# Patient Record
Sex: Female | Born: 1970 | Race: Asian | Hispanic: No | Marital: Married | Smoking: Never smoker
Health system: Southern US, Community
[De-identification: ages and names within clinical notes are randomized; demographics above are authoritative.]

---

## 2012-05-17 ENCOUNTER — Other Ambulatory Visit: Payer: Self-pay | Admitting: Specialist

## 2012-05-17 ENCOUNTER — Ambulatory Visit
Admission: RE | Admit: 2012-05-17 | Discharge: 2012-05-17 | Disposition: A | Discharge status: Home / Self Care | Payer: No Typology Code available for payment source | Source: Ambulatory Visit | Attending: Specialist | Admitting: Specialist

## 2012-05-17 DIAGNOSIS — R7611 Nonspecific reaction to tuberculin skin test without active tuberculosis: Secondary | ICD-10-CM

## 2019-12-05 ENCOUNTER — Other Ambulatory Visit: Payer: Self-pay | Admitting: Internal Medicine

## 2019-12-05 DIAGNOSIS — Z1231 Encounter for screening mammogram for malignant neoplasm of breast: Secondary | ICD-10-CM

## 2020-03-10 ENCOUNTER — Other Ambulatory Visit: Payer: Self-pay | Admitting: Obstetrics & Gynecology

## 2020-03-10 DIAGNOSIS — R928 Other abnormal and inconclusive findings on diagnostic imaging of breast: Secondary | ICD-10-CM

## 2020-03-15 ENCOUNTER — Ambulatory Visit
Admission: RE | Admit: 2020-03-15 | Discharge: 2020-03-15 | Disposition: A | Discharge status: Home / Self Care | Payer: Self-pay | Source: Ambulatory Visit | Attending: Obstetrics & Gynecology | Admitting: Obstetrics & Gynecology

## 2020-03-15 ENCOUNTER — Other Ambulatory Visit: Payer: Self-pay

## 2020-03-15 DIAGNOSIS — R928 Other abnormal and inconclusive findings on diagnostic imaging of breast: Secondary | ICD-10-CM

## 2020-11-08 ENCOUNTER — Other Ambulatory Visit: Payer: Self-pay | Admitting: Internal Medicine

## 2020-11-08 DIAGNOSIS — Z09 Encounter for follow-up examination after completed treatment for conditions other than malignant neoplasm: Secondary | ICD-10-CM

## 2020-11-25 ENCOUNTER — Other Ambulatory Visit: Payer: Self-pay | Admitting: Internal Medicine

## 2020-11-25 ENCOUNTER — Other Ambulatory Visit: Payer: Self-pay | Admitting: Obstetrics & Gynecology

## 2020-11-25 DIAGNOSIS — Z09 Encounter for follow-up examination after completed treatment for conditions other than malignant neoplasm: Secondary | ICD-10-CM

## 2020-12-20 ENCOUNTER — Ambulatory Visit
Admission: RE | Admit: 2020-12-20 | Discharge: 2020-12-20 | Disposition: A | Discharge status: Home / Self Care | Payer: PRIVATE HEALTH INSURANCE | Source: Ambulatory Visit | Attending: Internal Medicine | Admitting: Internal Medicine

## 2020-12-20 ENCOUNTER — Other Ambulatory Visit: Payer: Self-pay | Admitting: Internal Medicine

## 2020-12-20 ENCOUNTER — Other Ambulatory Visit: Payer: Self-pay

## 2020-12-20 DIAGNOSIS — N6320 Unspecified lump in the left breast, unspecified quadrant: Secondary | ICD-10-CM

## 2020-12-20 DIAGNOSIS — Z09 Encounter for follow-up examination after completed treatment for conditions other than malignant neoplasm: Secondary | ICD-10-CM

## 2021-03-07 ENCOUNTER — Other Ambulatory Visit: Payer: Self-pay | Admitting: Obstetrics & Gynecology

## 2021-03-08 ENCOUNTER — Ambulatory Visit
Admission: RE | Admit: 2021-03-08 | Discharge: 2021-03-08 | Disposition: A | Discharge status: Home / Self Care | Payer: PRIVATE HEALTH INSURANCE | Source: Ambulatory Visit | Attending: Internal Medicine | Admitting: Internal Medicine

## 2021-03-08 ENCOUNTER — Ambulatory Visit
Admission: RE | Admit: 2021-03-08 | Discharge: 2021-03-08 | Disposition: A | Discharge status: Home / Self Care | Payer: No Typology Code available for payment source | Source: Ambulatory Visit | Attending: Internal Medicine | Admitting: Internal Medicine

## 2021-03-08 DIAGNOSIS — N6320 Unspecified lump in the left breast, unspecified quadrant: Secondary | ICD-10-CM

## 2022-02-06 ENCOUNTER — Other Ambulatory Visit: Payer: Self-pay | Admitting: *Deleted

## 2022-02-06 DIAGNOSIS — N6320 Unspecified lump in the left breast, unspecified quadrant: Secondary | ICD-10-CM

## 2022-04-11 ENCOUNTER — Ambulatory Visit: Payer: No Typology Code available for payment source

## 2022-04-11 ENCOUNTER — Other Ambulatory Visit: Payer: No Typology Code available for payment source

## 2022-04-13 ENCOUNTER — Other Ambulatory Visit: Payer: No Typology Code available for payment source

## 2022-04-13 ENCOUNTER — Ambulatory Visit: Payer: No Typology Code available for payment source

## 2022-04-25 ENCOUNTER — Ambulatory Visit: Payer: Self-pay | Admitting: *Deleted

## 2022-04-25 ENCOUNTER — Ambulatory Visit
Admission: RE | Admit: 2022-04-25 | Discharge: 2022-04-25 | Disposition: A | Discharge status: Home / Self Care | Payer: No Typology Code available for payment source | Source: Ambulatory Visit | Attending: Obstetrics and Gynecology | Admitting: Obstetrics and Gynecology

## 2022-04-25 VITALS — BP 135/90 | Ht 63.0 in | Wt 200.0 lb

## 2022-04-25 DIAGNOSIS — Z1211 Encounter for screening for malignant neoplasm of colon: Secondary | ICD-10-CM

## 2022-04-25 DIAGNOSIS — Z1239 Encounter for other screening for malignant neoplasm of breast: Secondary | ICD-10-CM

## 2022-04-25 DIAGNOSIS — N6320 Unspecified lump in the left breast, unspecified quadrant: Secondary | ICD-10-CM

## 2022-04-25 NOTE — Progress Notes (Signed)
Ms. Isabella Christian is a 52 y.o. female who presents to [facility] clinic today with no complaints. Patient referred to [place] due to having a diagnostic mammogram and left breast ultrasound completed 03/08/2021 that a bilateral diagnostic mammogram and left breast ultrasound is recommended for follow up in one year.     Pap Smear: Pap smear not completed today. Last Pap smear was in December 2022 or January 2023 at Dr. Lynnda Child office and was normal per patient. Per patient has no history of an abnormal Pap smear. Last Pap smear result is not available in Epic.   Physical exam: Breasts Breasts symmetrical. No skin abnormalities bilateral breasts. No nipple retraction bilateral breasts. No nipple discharge bilateral breasts. No lymphadenopathy. No lumps palpated bilateral breasts. No complaints of pain or tenderness on exam.      MM DIAG BREAST TOMO BILATERAL  Result Date: 03/08/2021 CLINICAL DATA:  52 year old female presenting for annual bilateral mammogram and 1 year follow-up of a probably benign left breast mass. EXAM: DIGITAL DIAGNOSTIC BILATERAL MAMMOGRAM WITH TOMOSYNTHESIS AND CAD; ULTRASOUND LEFT BREAST LIMITED TECHNIQUE: Bilateral digital diagnostic mammography and breast tomosynthesis was performed. The images were evaluated with computer-aided detection.; Targeted ultrasound examination of the left breast was performed. COMPARISON:  Previous exam(s). ACR Breast Density Category b: There are scattered areas of fibroglandular density. FINDINGS: An oval, circumscribed equal density mass in the superior central left breast is mammographically stable. No new or suspicious findings in the remainder of either breast. Targeted ultrasound is performed, showing a stable oval, circumscribed hypoechoic mass at the deep 1 o'clock position 5 cm from the nipple. It measures 7 x 8 x 6 mm (previously 7 x 8 x 6 mm). An additional hypoechoic mass at the 12 o'clock position 7 cm from the nipple measures 1.6 x 1.3 x 0.5 cm,  also stable from prior. IMPRESSION: 1. Stable, probably benign left breast masses. Recommend a final follow-up in 1 year. 2. No mammographic evidence of malignancy on the right. RECOMMENDATION: Bilateral diagnostic mammogram and left breast ultrasound in 1 year. I have discussed the findings and recommendations with the patient. If applicable, a reminder letter will be sent to the patient regarding the next appointment. BI-RADS CATEGORY  3: Probably benign. Electronically Signed   By: Kristopher Oppenheim M.D.   On: 03/08/2021 12:52    Pelvic/Bimanual Pap is not indicated today per BCCCP guidelines.   Smoking History: Patient has never smoked.   Patient Navigation: Patient education provided. Access to services provided for patient through [place] interpreter Isabella Christian from [facility] provided.   Colorectal Cancer Screening: Per patient has never had colonoscopy completed. FIT Test given to patient to complete. No complaints today.    Breast and Cervical Cancer Risk Assessment: Patient does not have family history of breast cancer, known genetic mutations, or radiation treatment to the chest before age 64. Patient does not have history of cervical dysplasia, immunocompromised, or DES exposure in-utero.  Risk Scores as of 04/25/2022     Isabella Christian           5-year 0.6 %   Lifetime 3.89 %            Last calculated by Claretha Cooper, CMA on 04/25/2022 at 12:00 PM        A: BCCCP exam without pap smear No complaints.  P: Referred patient to the [facility] for a diagnostic mammogram per recommendation. Appointment scheduled Tuesday, April 25, 2022 at 1400.  Godwin Tedesco, Heath Gold,  RN 04/25/2022 12:31 PM

## 2022-04-25 NOTE — Patient Instructions (Signed)
Explained breast self awareness with Isabella Christian. Patient did not need a Pap smear today due to last Pap smear was in December 2022 or January 2023 per patient. Let her know BCCCP will cover Pap smears every 3 years unless has a history of abnormal Pap smears. Referred patient to the [facility] for a diagnostic mammogram per recommendation. Appointment scheduled Tuesday, April 25, 2022 at 1400. Patient aware of appointment and will be there. Isabella Christian verbalized understanding.  Isabella Christian, Arvil Chaco, RN 12:31 PM

## 2022-05-03 LAB — FECAL OCCULT BLOOD, IMMUNOCHEMICAL: Fecal Occult Bld: NEGATIVE

## 2022-05-09 ENCOUNTER — Telehealth: Payer: Self-pay

## 2022-05-09 NOTE — Telephone Encounter (Signed)
Using [employer], [place], [place]: [id number], pt has been advised her FIT test results were normal and to repeat the test in one year. Pt expressed understanding of this information.

## 2023-01-12 IMAGING — MG DIGITAL DIAGNOSTIC BILAT W/ TOMO W/ CAD
8 series · 8 of 24 positions shown · non-contrast
Comparison: Previous exam(s).

CLINICAL DATA: 50-year-old female presenting for annual bilateral
mammogram and 1 year follow-up of a probably benign left breast
mass.

EXAM:
DIGITAL DIAGNOSTIC BILATERAL MAMMOGRAM WITH TOMOSYNTHESIS AND CAD;
ULTRASOUND LEFT BREAST LIMITED
TECHNIQUE: Bilateral digital diagnostic mammography and breast tomosynthesis
was performed. The images were evaluated with computer-aided
detection.; Targeted ultrasound examination of the left breast was
performed.

[L MLO synth-2D]
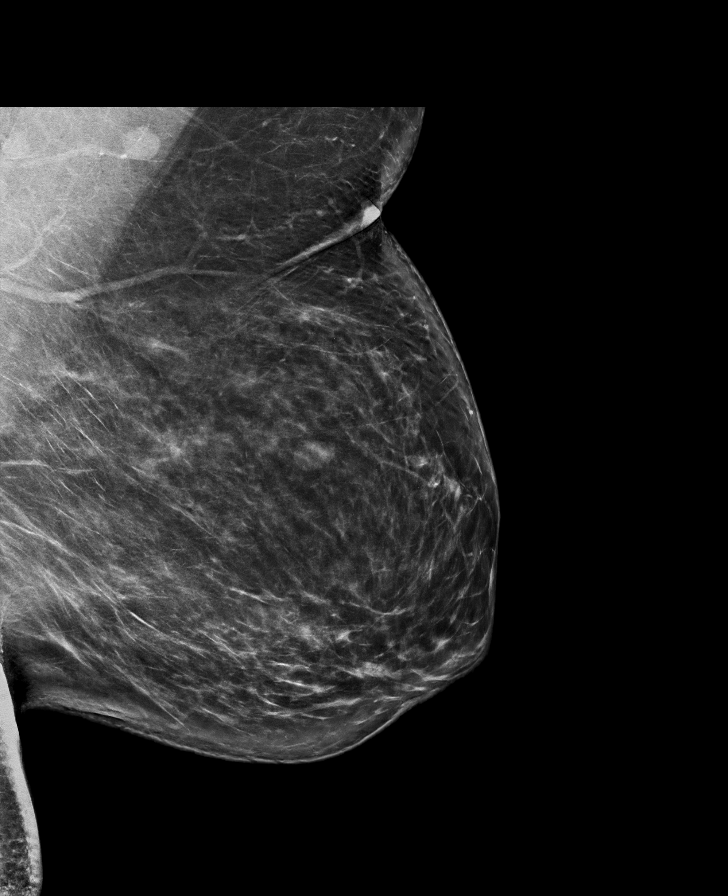

[R MLO synth-2D]
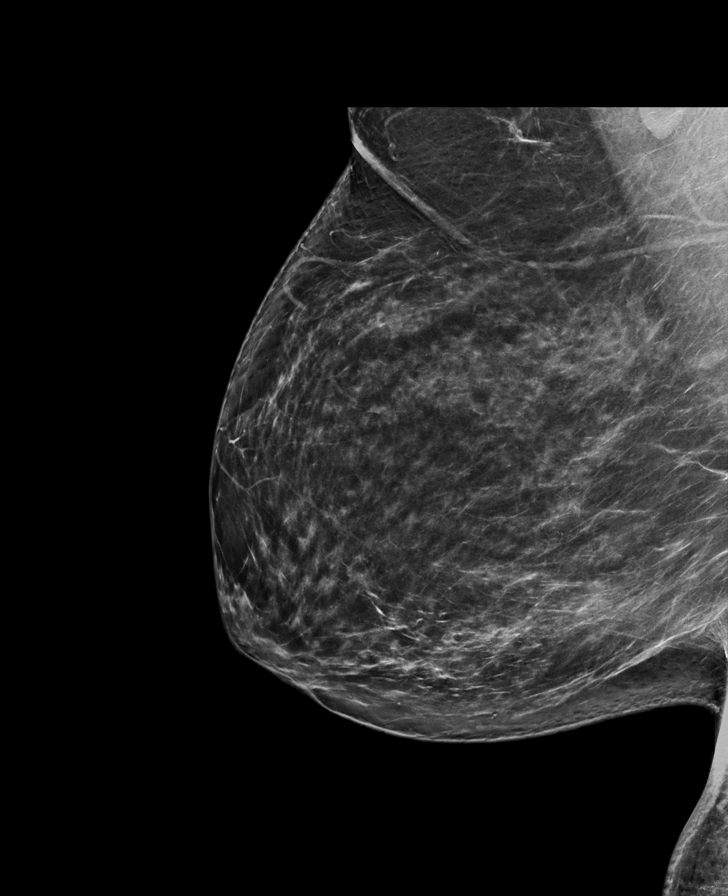

[R CC synth-2D]
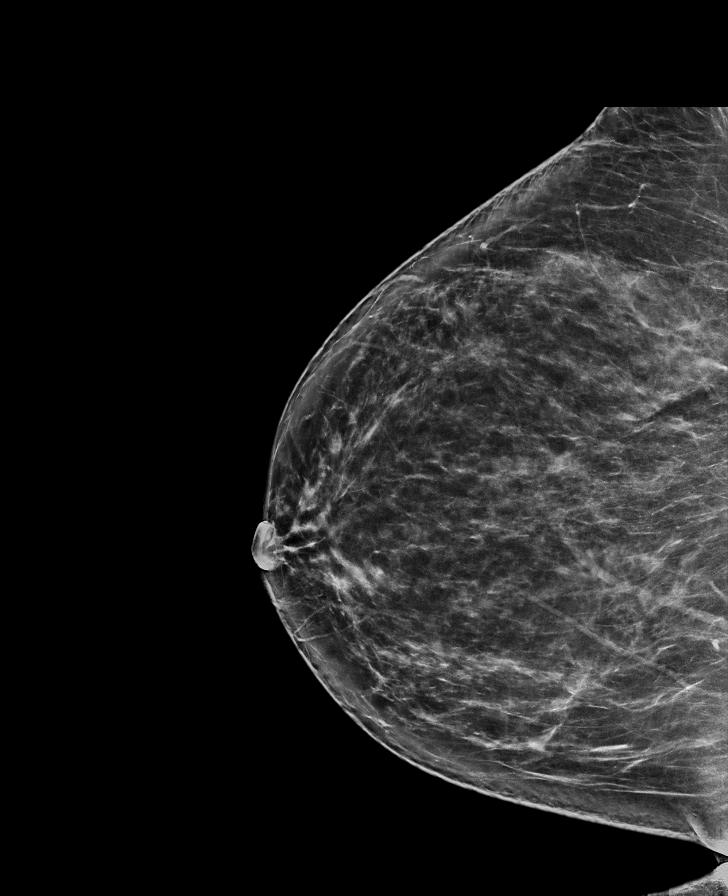

[L CC synth-2D]
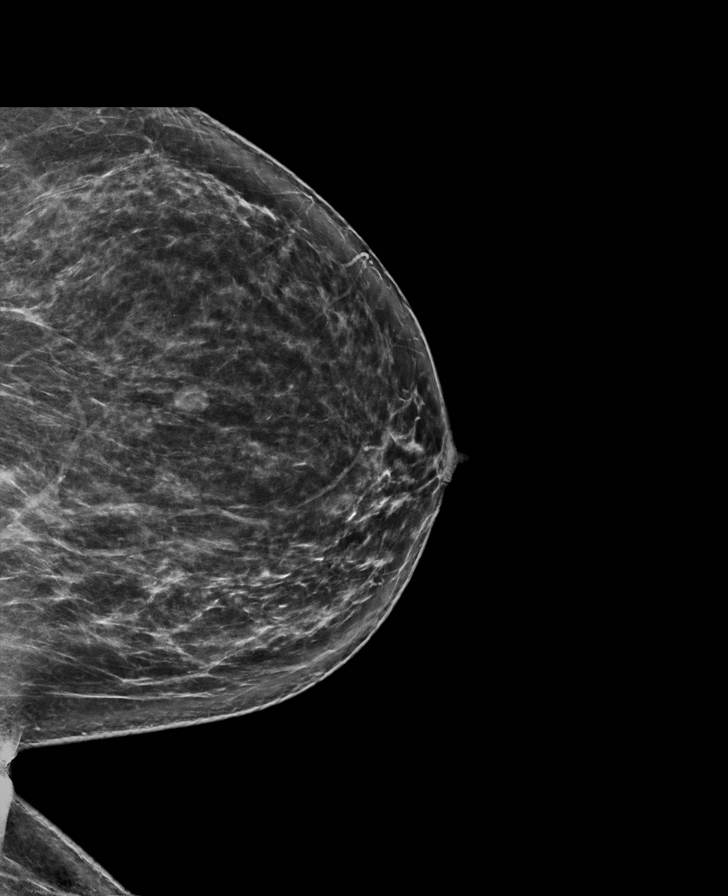

[L MLO tomo · tomo slice 47/94.0]
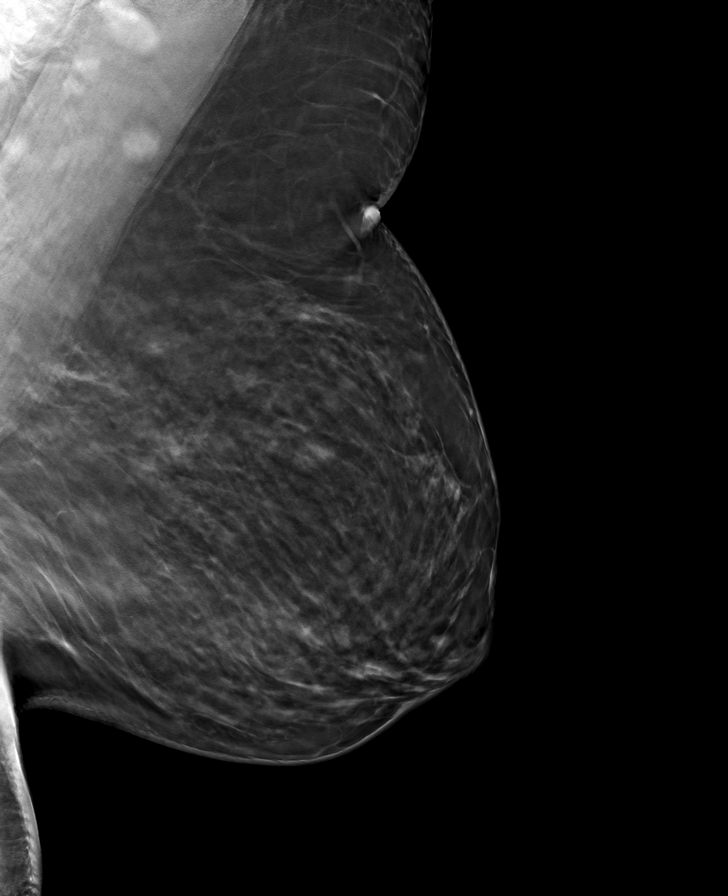

[R MLO tomo · tomo slice 44/87.0]
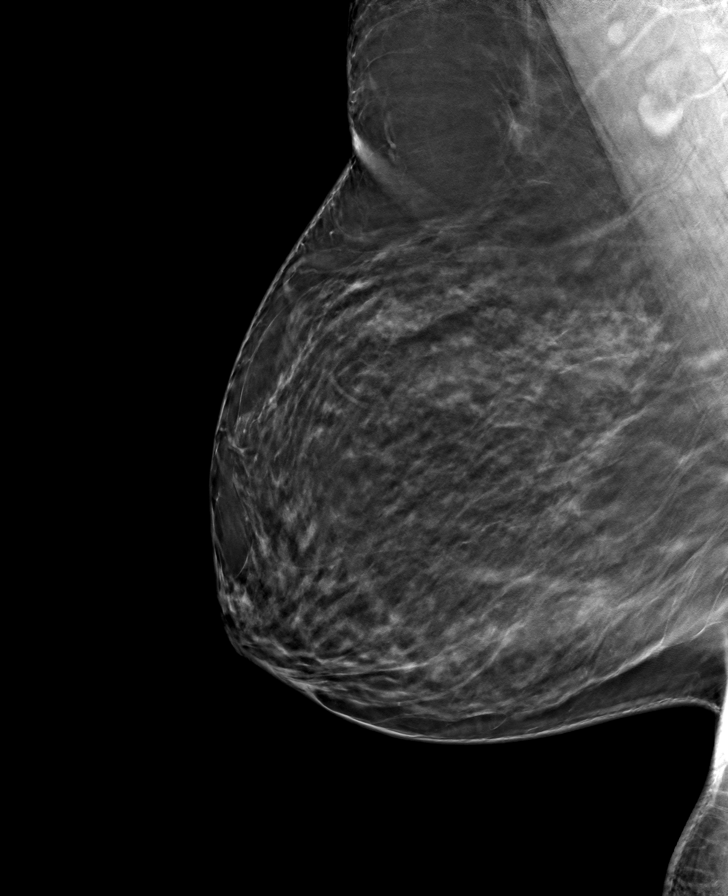

[R CC tomo · tomo slice 43/84.0]
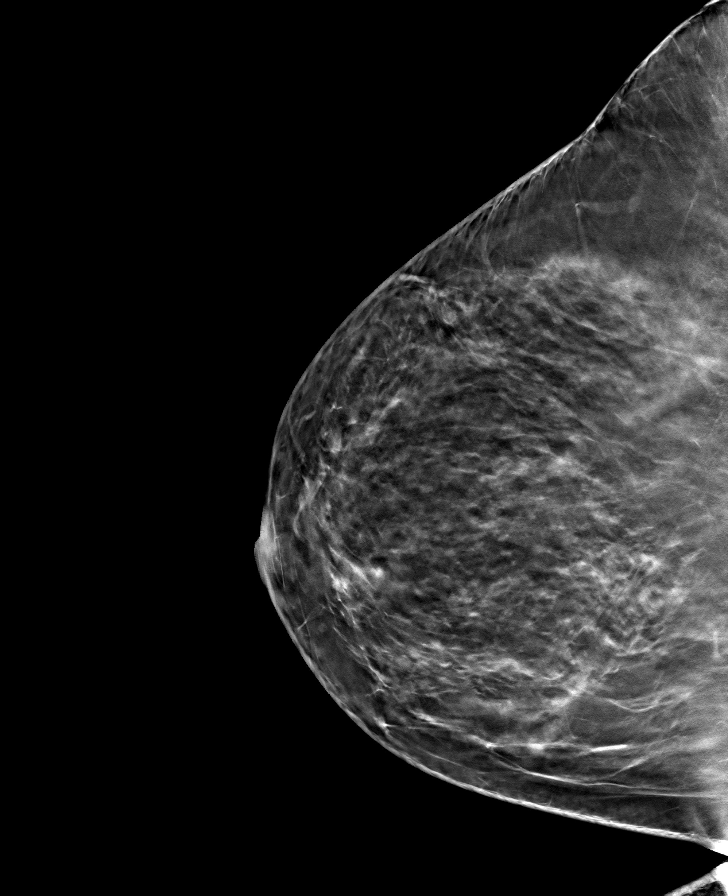

[L CC tomo · tomo slice 40/79.0]
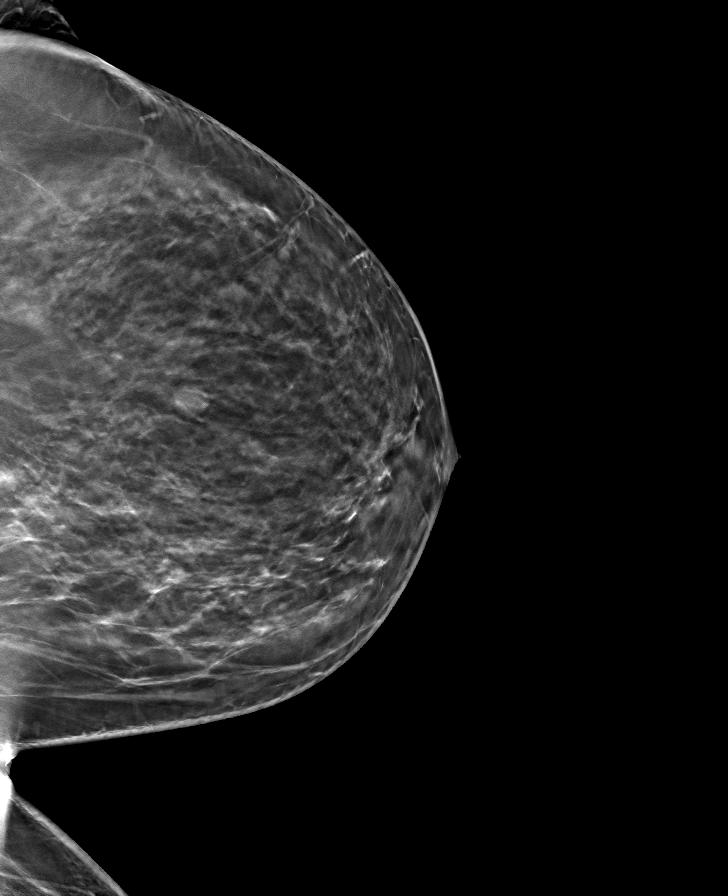

[8 of 24 positions shown; findings below may reference images not displayed]

ACR Breast Density Category b: There are scattered areas of
fibroglandular density.
FINDINGS: An oval, circumscribed equal density mass in the superior central
left breast is mammographically stable. No new or suspicious
findings in the remainder of either breast.

Targeted ultrasound is performed, showing a stable oval,
circumscribed hypoechoic mass at the deep 1 o'clock position 5 cm
from the nipple. It measures 7 x 8 x 6 mm (previously 7 x 8 x 6 mm).
An additional hypoechoic mass at the 12 o'clock position 7 cm from
the nipple measures 1.6 x 1.3 x 0.5 cm, also stable from prior.
IMPRESSION: 1. Stable, probably benign left breast masses. Recommend a final
follow-up in 1 year.
2. No mammographic evidence of malignancy on the right.

RECOMMENDATION:
Bilateral diagnostic mammogram and left breast ultrasound in 1 year.

I have discussed the findings and recommendations with the patient.
If applicable, a reminder letter will be sent to the patient
regarding the next appointment.

BI-RADS CATEGORY  3: Probably benign.

## 2023-01-12 IMAGING — US US BREAST*L* LIMITED INC AXILLA
1 series · 9 of 9 positions shown · non-contrast
Comparison: Previous exam(s).

CLINICAL DATA: 50-year-old female presenting for annual bilateral
mammogram and 1 year follow-up of a probably benign left breast
mass.

EXAM:
DIGITAL DIAGNOSTIC BILATERAL MAMMOGRAM WITH TOMOSYNTHESIS AND CAD;
ULTRASOUND LEFT BREAST LIMITED
TECHNIQUE: Bilateral digital diagnostic mammography and breast tomosynthesis
was performed. The images were evaluated with computer-aided
detection.; Targeted ultrasound examination of the left breast was
performed.

[Series 1: us breast*left* limited inc axilla · 0.07mm/px · 9 of 9 slices shown]
[im 1/9]
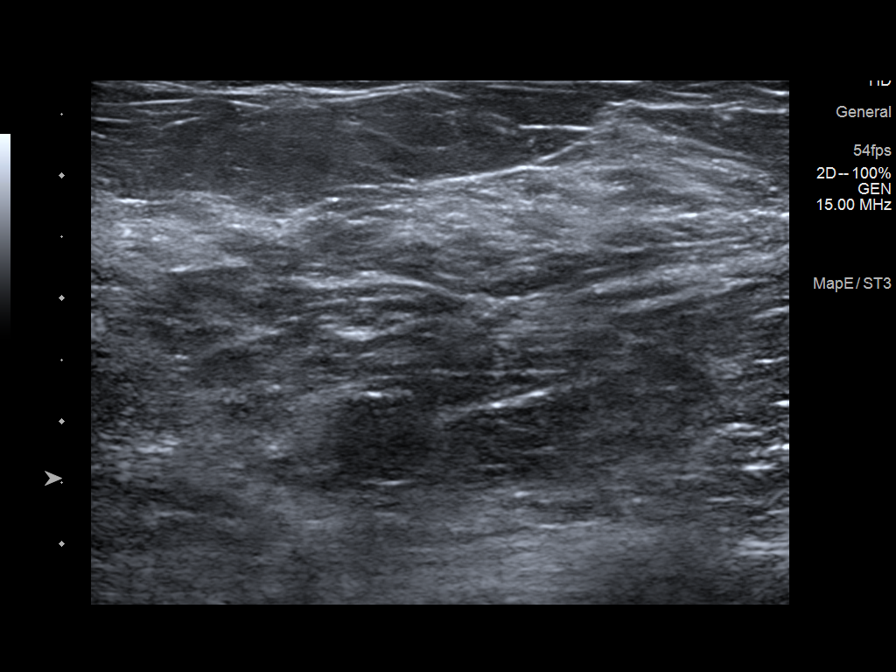
[im 2/9]
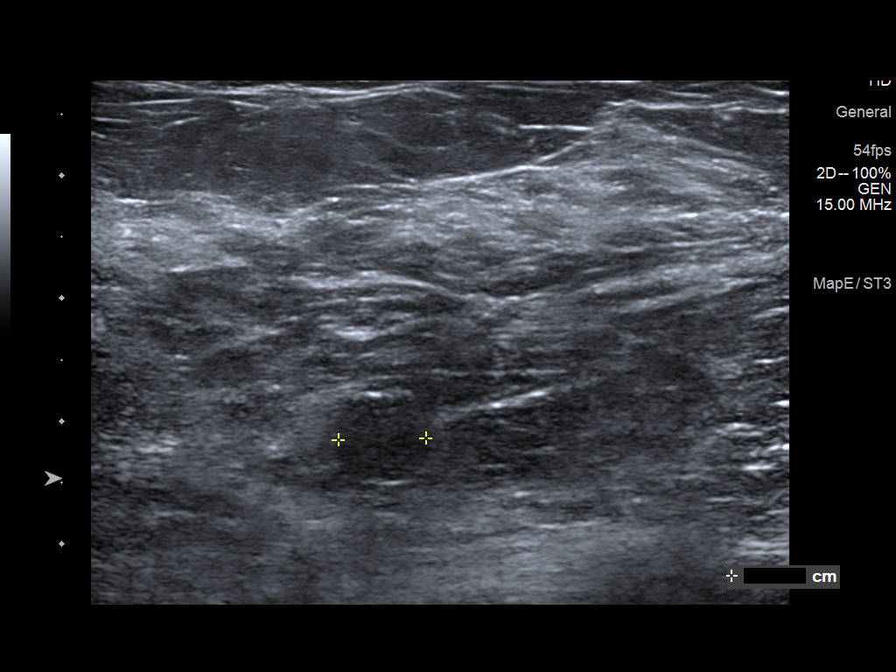
[im 3/9]
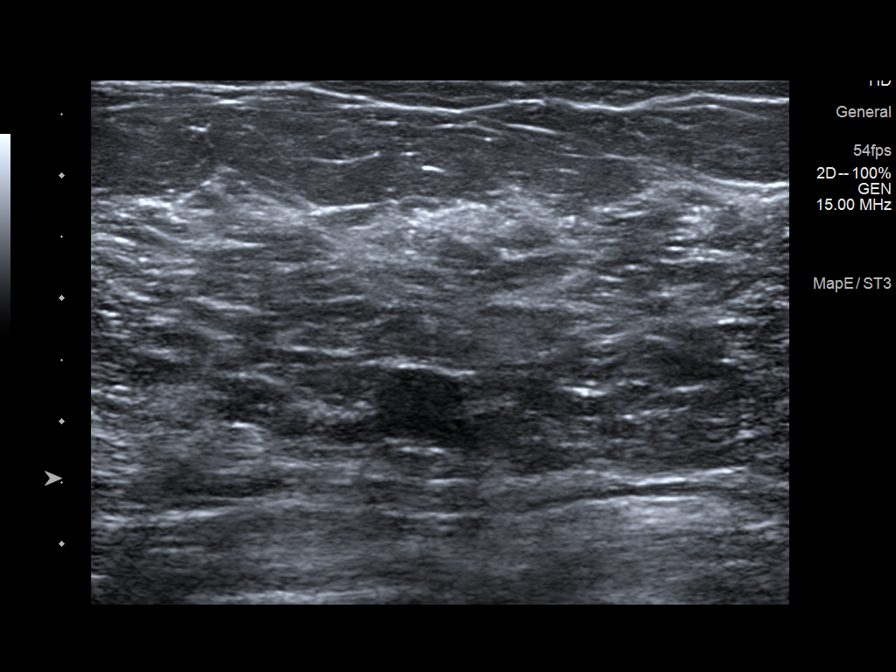
[im 4/9]
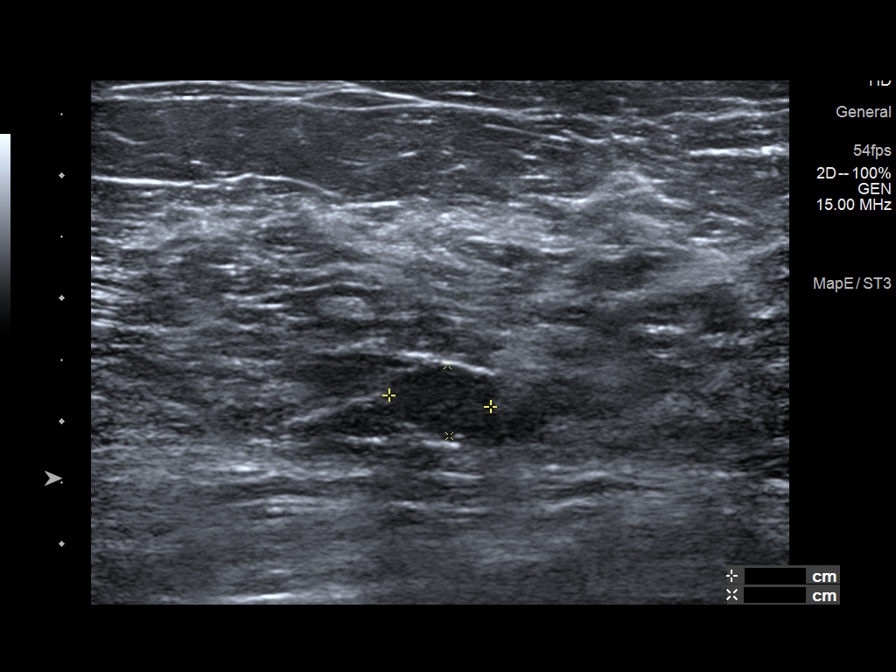
[im 5/9]
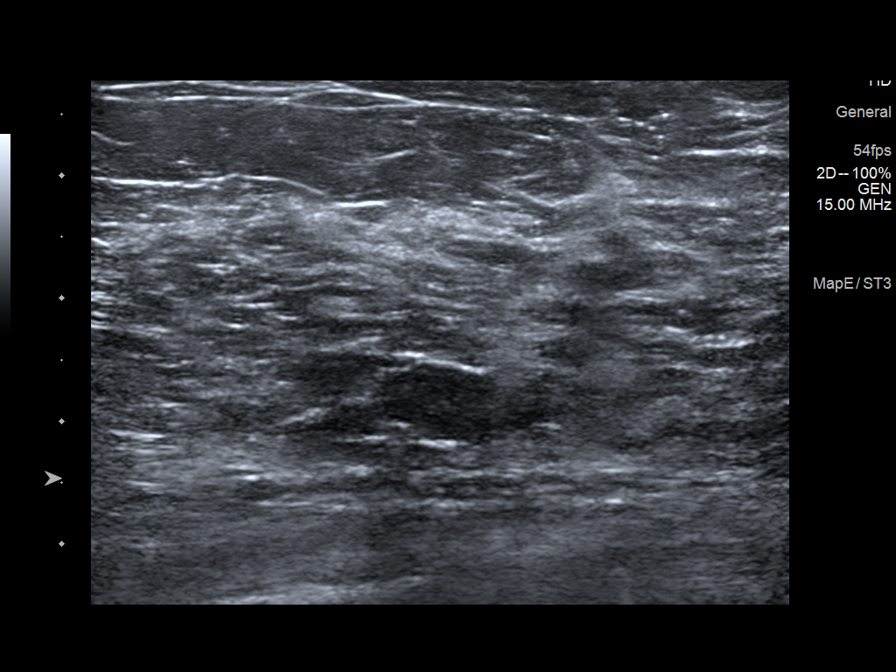
[im 6/9]
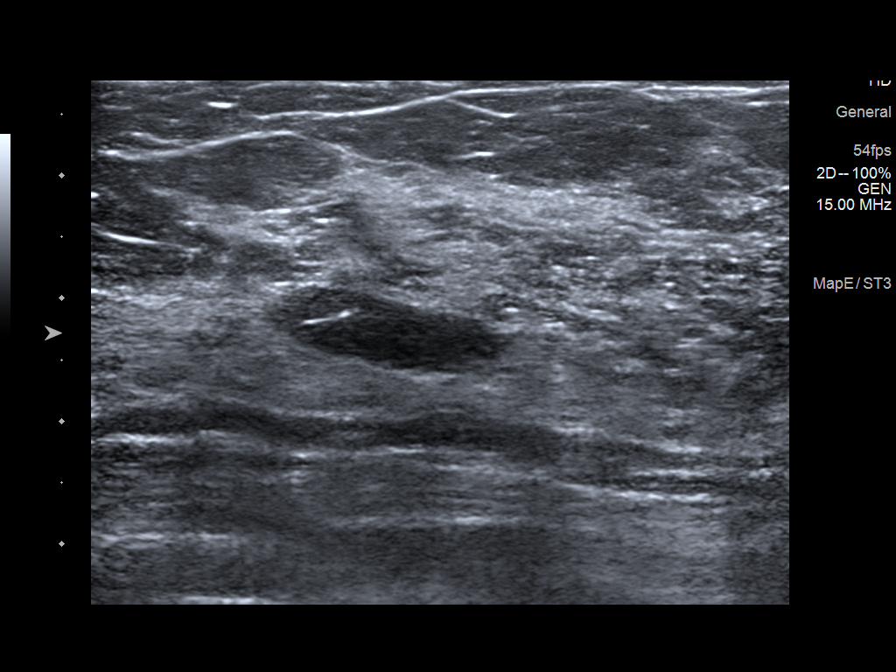
[im 7/9]
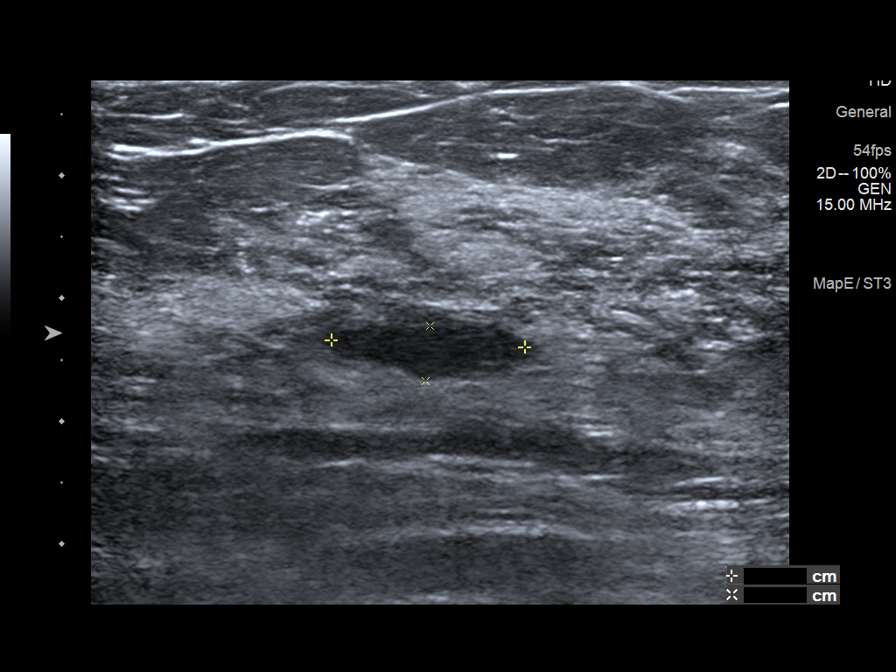
[im 8/9]
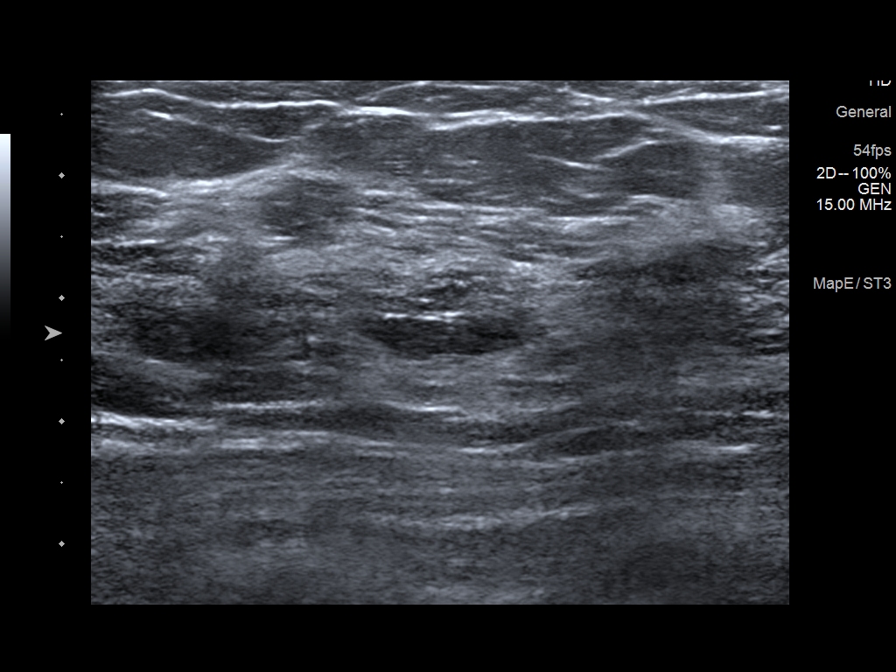
[im 9/9]
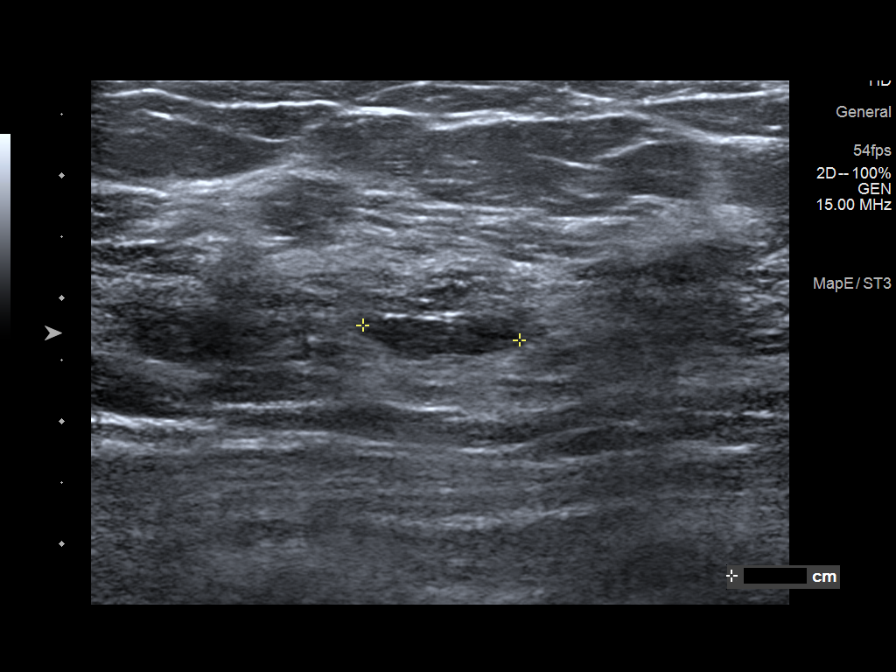

[9 of 9 positions shown; findings below may reference images not displayed]

ACR Breast Density Category b: There are scattered areas of
fibroglandular density.
FINDINGS: An oval, circumscribed equal density mass in the superior central
left breast is mammographically stable. No new or suspicious
findings in the remainder of either breast.

Targeted ultrasound is performed, showing a stable oval,
circumscribed hypoechoic mass at the deep 1 o'clock position 5 cm
from the nipple. It measures 7 x 8 x 6 mm (previously 7 x 8 x 6 mm).
An additional hypoechoic mass at the 12 o'clock position 7 cm from
the nipple measures 1.6 x 1.3 x 0.5 cm, also stable from prior.
IMPRESSION: 1. Stable, probably benign left breast masses. Recommend a final
follow-up in 1 year.
2. No mammographic evidence of malignancy on the right.

RECOMMENDATION:
Bilateral diagnostic mammogram and left breast ultrasound in 1 year.

I have discussed the findings and recommendations with the patient.
If applicable, a reminder letter will be sent to the patient
regarding the next appointment.

BI-RADS CATEGORY  3: Probably benign.

## 2023-03-20 ENCOUNTER — Other Ambulatory Visit: Payer: Self-pay | Admitting: Internal Medicine

## 2023-03-20 DIAGNOSIS — Z1231 Encounter for screening mammogram for malignant neoplasm of breast: Secondary | ICD-10-CM

## 2023-04-27 ENCOUNTER — Ambulatory Visit
Admission: RE | Admit: 2023-04-27 | Discharge: 2023-04-27 | Disposition: A | Discharge status: Home / Self Care | Payer: No Typology Code available for payment source | Source: Ambulatory Visit | Attending: Internal Medicine | Admitting: Internal Medicine

## 2023-04-27 DIAGNOSIS — Z1231 Encounter for screening mammogram for malignant neoplasm of breast: Secondary | ICD-10-CM
